# Patient Record
Sex: Male | Born: 1980 | Race: White | Hispanic: No | Marital: Single | Smoking: Former smoker
Health system: Southern US, Community
[De-identification: ages and names within clinical notes are randomized; demographics above are authoritative.]

---

## 2013-11-13 ENCOUNTER — Emergency Department (HOSPITAL_COMMUNITY)
Admission: EM | Admit: 2013-11-13 | Discharge: 2013-11-13 | Disposition: A | Discharge status: Home / Self Care | Payer: Self-pay | Attending: Emergency Medicine | Admitting: Emergency Medicine

## 2013-11-13 ENCOUNTER — Emergency Department (HOSPITAL_COMMUNITY): Payer: Self-pay

## 2013-11-13 ENCOUNTER — Encounter (HOSPITAL_COMMUNITY): Payer: Self-pay | Admitting: Emergency Medicine

## 2013-11-13 DIAGNOSIS — Y929 Unspecified place or not applicable: Secondary | ICD-10-CM | POA: Insufficient documentation

## 2013-11-13 DIAGNOSIS — Y9389 Activity, other specified: Secondary | ICD-10-CM | POA: Insufficient documentation

## 2013-11-13 DIAGNOSIS — S62309A Unspecified fracture of unspecified metacarpal bone, initial encounter for closed fracture: Secondary | ICD-10-CM | POA: Insufficient documentation

## 2013-11-13 DIAGNOSIS — S62304A Unspecified fracture of fourth metacarpal bone, right hand, initial encounter for closed fracture: Secondary | ICD-10-CM

## 2013-11-13 DIAGNOSIS — Z87891 Personal history of nicotine dependence: Secondary | ICD-10-CM | POA: Insufficient documentation

## 2013-11-13 DIAGNOSIS — IMO0002 Reserved for concepts with insufficient information to code with codable children: Secondary | ICD-10-CM | POA: Insufficient documentation

## 2013-11-13 MED ORDER — OXYCODONE-ACETAMINOPHEN 5-325 MG PO TABS: 1.0000 | ORAL_TABLET | ORAL | Status: AC | PRN

## 2013-11-13 NOTE — Discharge Instructions (Signed)
Take the prescribed medication as directed. Follow-up with hand surgery, Dr. Izora Ribasoley.  Call and schedule appt. Return to the ED for new or worsening symptoms.
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
## 2013-11-19 NOTE — ED Provider Notes (Signed)
Medical screening examination/treatment/procedure(s) were performed by non-physician practitioner and as supervising physician I was immediately available for consultation/collaboration.   EKG Interpretation None        Suzi RootsKevin E Edris Friedt, MD 11/19/13 [phone]

## 2014-09-12 IMAGING — CR DG HAND COMPLETE 3+V*R*
3 series · 3 of 3 positions shown · non-contrast
Comparison: None.

CLINICAL DATA: Right hand pain/swelling

EXAM:
RIGHT HAND - COMPLETE 3+ VIEW

[x hand pa right]
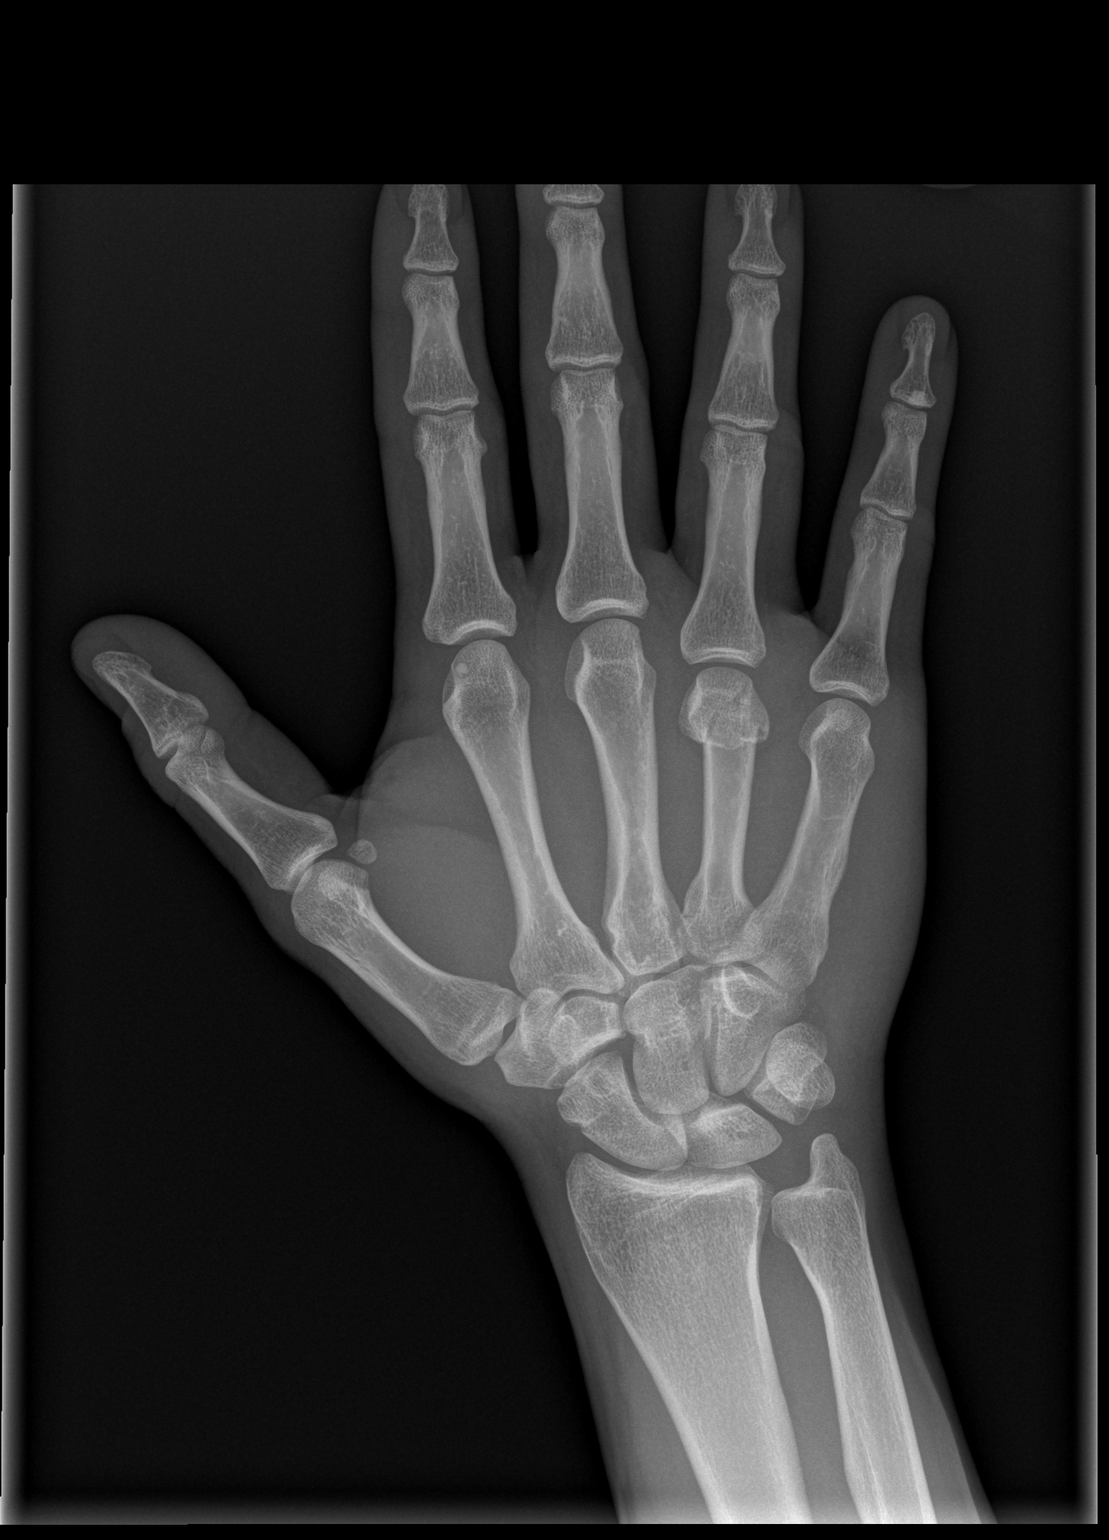

[x hand obl right]
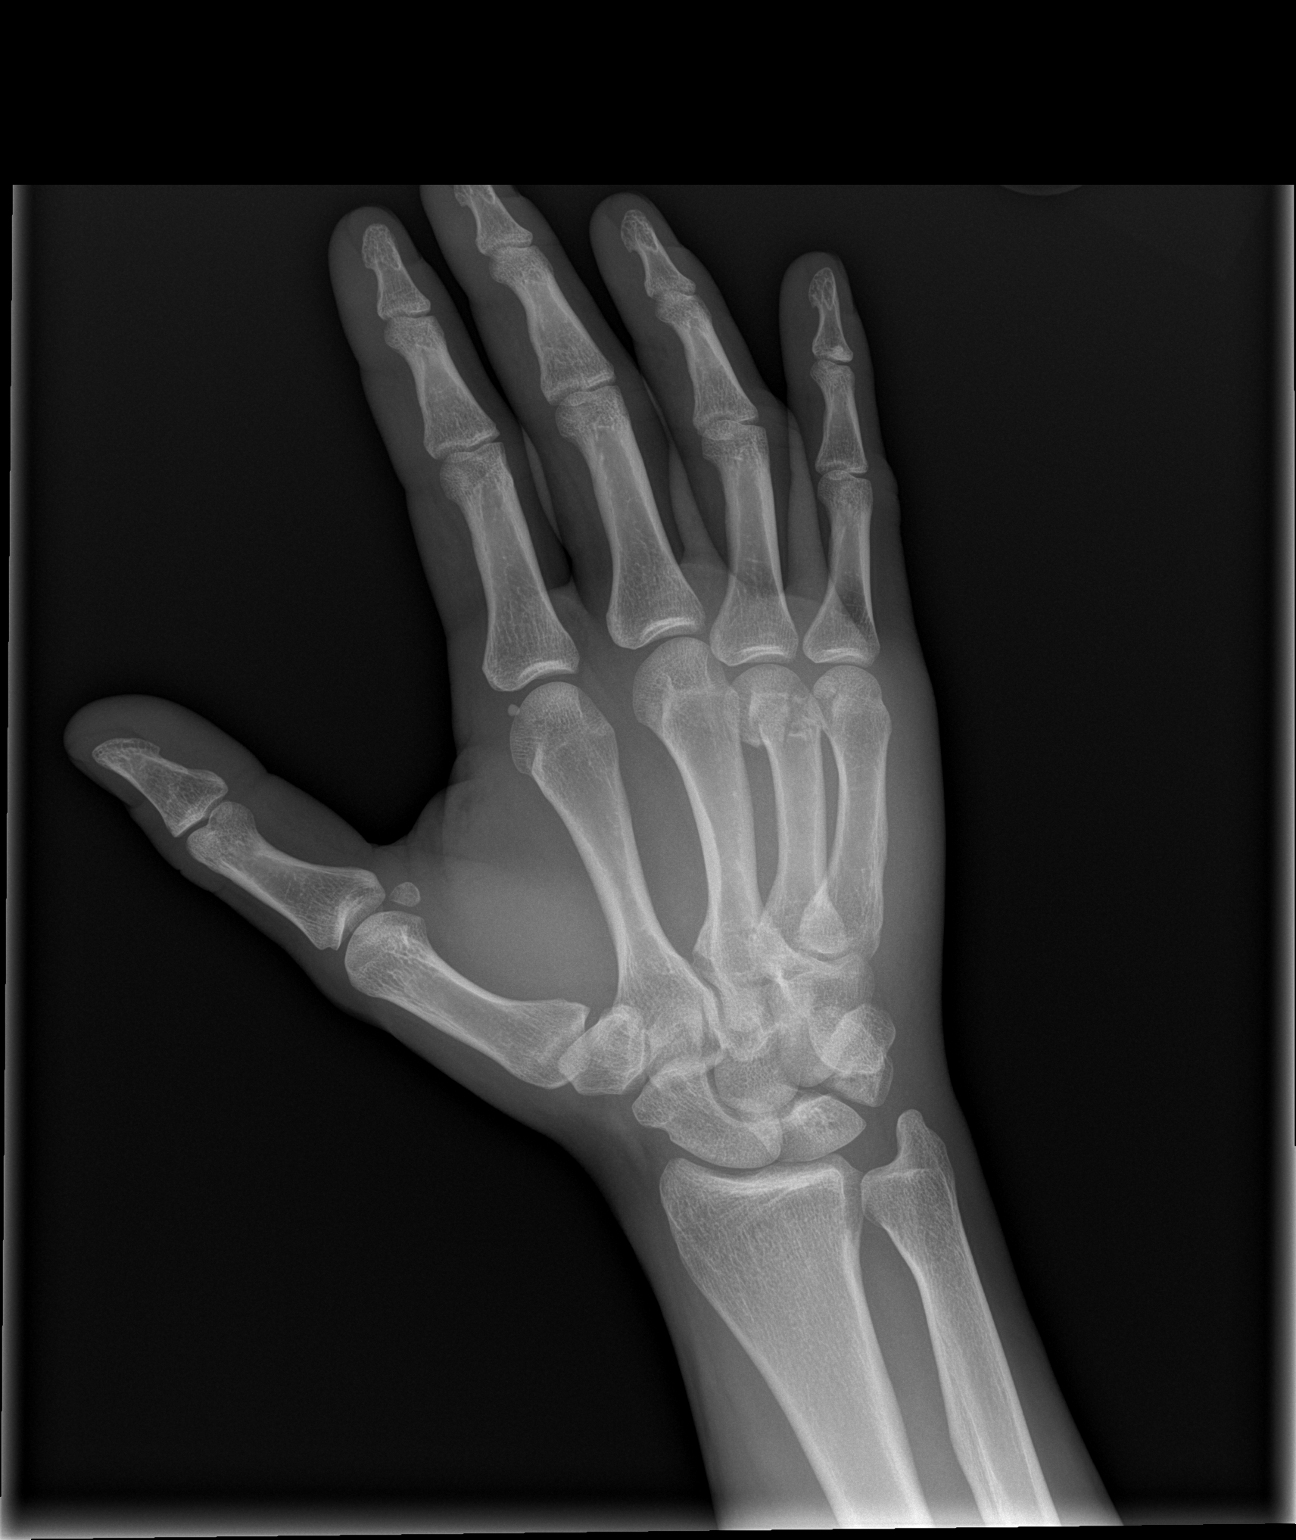

[x hand lat right]
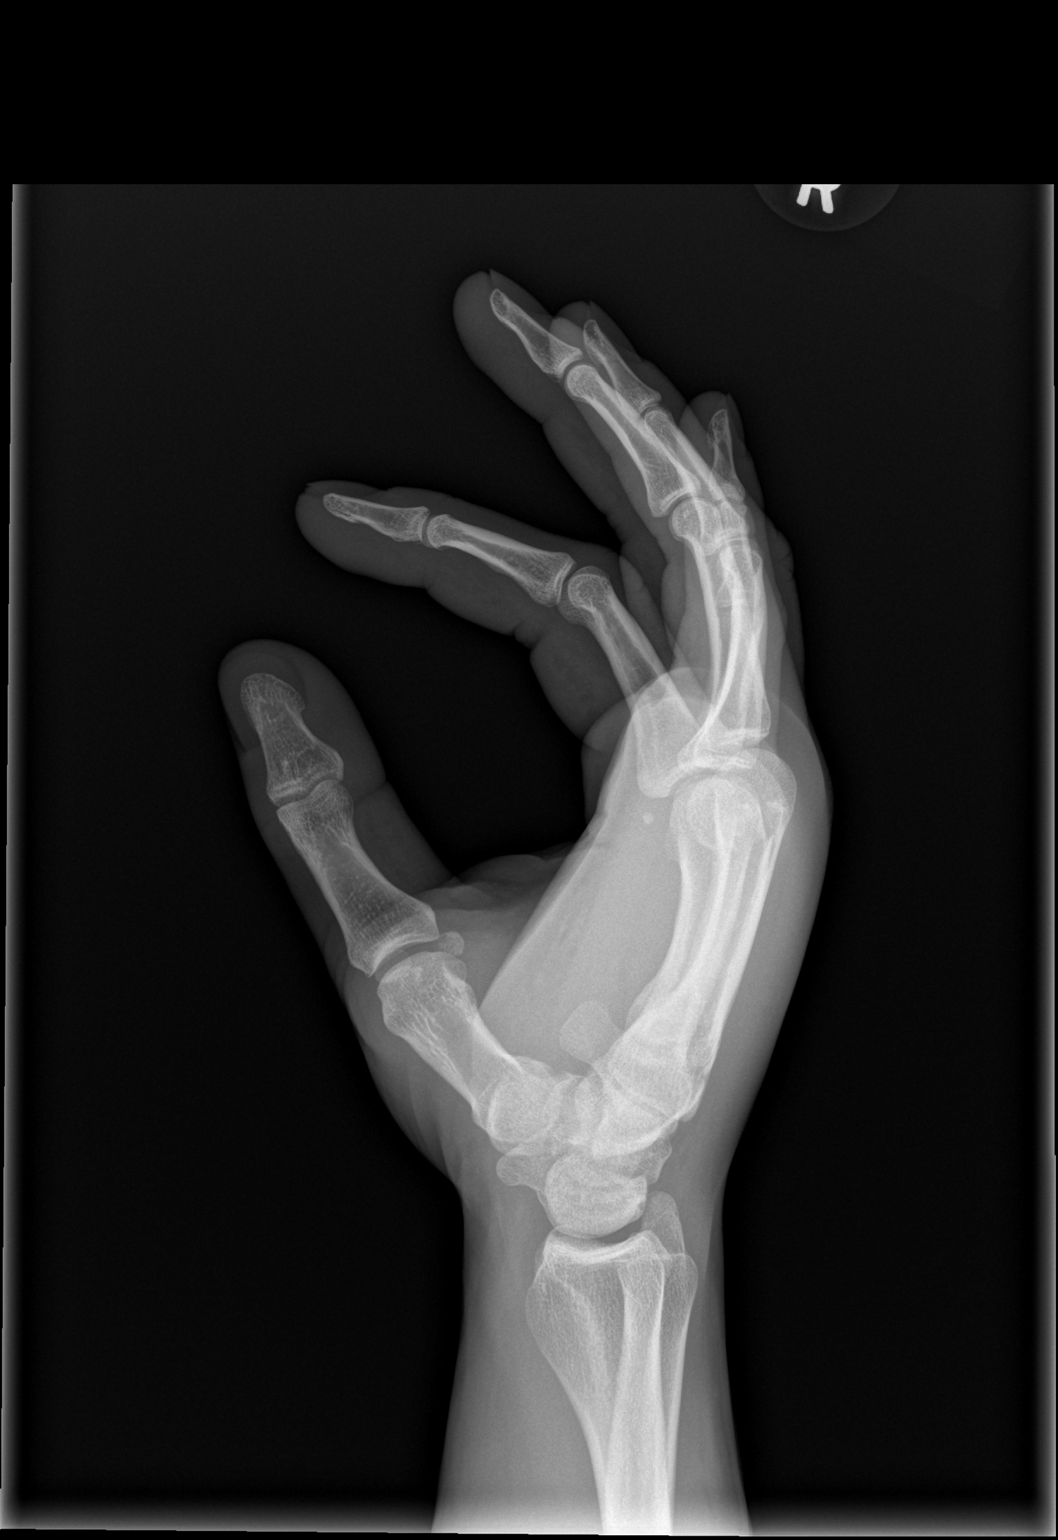

[3 of 3 positions shown; findings below may reference images not displayed]

FINDINGS: Nondisplaced, mildly comminuted fracture involving the 4th
metacarpal head.

Suspected old posttraumatic deformity involving the 5th metacarpal.

Mild soft tissue swelling along the dorsum of the hand.
IMPRESSION: Nondisplaced fracture involving the 4th metacarpal head.
